# Patient Record
Sex: Male | Born: 1995 | Race: White | Hispanic: No | Marital: Single | Smoking: Never smoker
Health system: Southern US, Community
[De-identification: ages and names within clinical notes are randomized; demographics above are authoritative.]

## PROBLEM LIST (undated history)

## (undated) DIAGNOSIS — R1310 Dysphagia, unspecified: Secondary | ICD-10-CM

## (undated) DIAGNOSIS — F1990 Other psychoactive substance use, unspecified, uncomplicated: Secondary | ICD-10-CM

## (undated) DIAGNOSIS — T7840XA Allergy, unspecified, initial encounter: Secondary | ICD-10-CM

## (undated) DIAGNOSIS — F988 Other specified behavioral and emotional disorders with onset usually occurring in childhood and adolescence: Secondary | ICD-10-CM

## (undated) DIAGNOSIS — J45909 Unspecified asthma, uncomplicated: Secondary | ICD-10-CM

## (undated) HISTORY — DX: Allergy, unspecified, initial encounter: T78.40XA

## (undated) HISTORY — DX: Unspecified asthma, uncomplicated: J45.909

## (undated) HISTORY — PX: TONSILLECTOMY AND ADENOIDECTOMY: SUR1326

## (undated) HISTORY — DX: Dysphagia, unspecified: R13.10

## (undated) HISTORY — DX: Other specified behavioral and emotional disorders with onset usually occurring in childhood and adolescence: F98.8

## (undated) HISTORY — DX: Other psychoactive substance use, unspecified, uncomplicated: F19.90

---
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
## 2017-04-09 ENCOUNTER — Encounter: Payer: Self-pay | Admitting: Physician Assistant

## 2018-07-01 ENCOUNTER — Ambulatory Visit
Admission: EM | Admit: 2018-07-01 | Discharge: 2018-07-01 | Disposition: A | Discharge status: Home / Self Care | Payer: BC Managed Care – PPO

## 2018-07-01 ENCOUNTER — Other Ambulatory Visit: Payer: Self-pay

## 2018-07-01 ENCOUNTER — Encounter: Payer: Self-pay | Admitting: Family Medicine

## 2018-07-01 DIAGNOSIS — H60392 Other infective otitis externa, left ear: Secondary | ICD-10-CM | POA: Diagnosis not present

## 2018-07-01 MED ORDER — NEOMYCIN-POLYMYXIN-HC 3.5-10000-1 OT SUSP: 4.0000 [drp] | Freq: Three times a day (TID) | OTIC | 0 refills | Status: DC

## 2018-07-01 NOTE — ED Notes (Signed)
Patient able to ambulate independently  

## 2018-07-01 NOTE — ED Triage Notes (Signed)
Pt presents to [facility] for assessment of left ear pain, fullness, and dulled hearing x 3 days.  States he has used hydrogen peroxide and an Environmental education officer without relief.

## 2018-07-01 NOTE — ED Provider Notes (Signed)
Lindsay   [id number] 07/01/18 Arrival Time: 3244  ASSESSMENT & PLAN:  1. Infective otitis externa of left ear     Meds ordered this encounter  Medications   neomycin-polymyxin-hydrocortisone (CORTISPORIN) 3.5-10000-1 OTIC suspension    Sig: Place 4 drops into the left ear 3 (three) times daily.    Dispense:  10 mL    Refill:  0   Discussed typical duration of symptoms. OTC symptom care as needed. Ensure adequate fluid intake and rest. May f/u with PCP or here as needed if not improving over the next 3-5 days.  Reviewed expectations re: course of current medical issues. Questions answered. Outlined signs and symptoms indicating need for more acute intervention. Patient verbalized understanding. After Visit Summary given.   SUBJECTIVE: History from: patient.  Miking Usrey is a 23 y.o. male who presents with complaint of left otalgia; without drainage; without bleeding. Onset gradual, over the past 2-3 d. Recent cold symptoms: none. Fever: no. Overall normal PO intake without n/v. Sick contacts: none known. No significant hearing changes. No FB inserted into ears. OTC treatment: OTC H2O2 and 'ear cleaning kit' without much relief.  Social History   Tobacco Use  Smoking Status Never Smoker  Smokeless Tobacco Never Used    ROS: As per HPI.   OBJECTIVE:  Vitals:   07/01/18 1044  BP: 140/85  Pulse: 81  Resp: 18  Temp: 98.6 F (37 C)  TempSrc: Oral  SpO2: 97%     General appearance: alert; NAD HEENT: throat normal Ear Canal: edema and inflammation on the left TM: bilateral: normal and intact Neck: supple without LAD Lungs: unlabored respirations, symmetrical air entry; cough: absent; no respiratory distress Skin: warm and dry Psychological: alert and cooperative; normal mood and affect  No Known Allergies  Past Medical History:  Diagnosis Date   ADD (attention deficit disorder)    Allergy    Seasonal, Pollen   Asthma    Once or  twice a year    Family History  Problem Relation Age of Onset   Asthma Mother    Cancer Father    Hyperlipidemia Father    Asthma Brother    Cancer Maternal Grandmother    Diabetes Paternal Grandmother    Hyperlipidemia Paternal Grandmother    Hypertension Paternal Grandmother    Social History   Socioeconomic History   Marital status: Single    Spouse name: Not on file   Number of children: Not on file   Years of education: Not on file   Highest education level: Not on file  Occupational History   Occupation: Astronomer    Comment: [employer]   Occupation: Management consultant strain: Not on file   Food insecurity    Worry: Not on file    Inability: Not on Lexicographer needs    Medical: Not on file    Non-medical: Not on file  Tobacco Use   Smoking status: Never Smoker   Smokeless tobacco: Never Used  Substance and Sexual Activity   Alcohol use: No   Drug use: No    Comment: previously tried Marijuana and Acid, once each   Sexual activity: Not Currently  Lifestyle   Physical activity    Days per week: Not on file    Minutes per session: Not on file   Stress: Not on file  Relationships   Social connections    Talks on phone: Not on file  Gets together: Not on file    Attends religious service: Not on file    Active member of club or organization: Not on file    Attends meetings of clubs or organizations: Not on file    Relationship status: Not on file   Intimate partner violence    Fear of current or ex partner: Not on file    Emotionally abused: Not on file    Physically abused: Not on file    Forced sexual activity: Not on file  Other Topics Concern   Not on file  Social History Narrative   Attends [employer] intent to transfer with a degree in Math/ Business   Currently lives at home with his mother and younger brothers (twins).                Vanessa Kick, MD 07/01/18 [phone]

## 2018-08-10 ENCOUNTER — Ambulatory Visit: Payer: Self-pay | Admitting: Family Medicine

## 2018-08-10 DIAGNOSIS — Z0289 Encounter for other administrative examinations: Secondary | ICD-10-CM

## 2021-04-04 DIAGNOSIS — Z0289 Encounter for other administrative examinations: Secondary | ICD-10-CM

## 2021-04-10 ENCOUNTER — Ambulatory Visit (INDEPENDENT_AMBULATORY_CARE_PROVIDER_SITE_OTHER): Payer: 59 | Admitting: Family Medicine

## 2021-04-10 ENCOUNTER — Encounter (INDEPENDENT_AMBULATORY_CARE_PROVIDER_SITE_OTHER): Payer: Self-pay | Admitting: Family Medicine

## 2021-04-10 VITALS — BP 113/74 | HR 101 | Temp 98.5°F | Ht 71.0 in | Wt >= 6400 oz

## 2021-04-10 DIAGNOSIS — R5383 Other fatigue: Secondary | ICD-10-CM | POA: Diagnosis not present

## 2021-04-10 DIAGNOSIS — R0602 Shortness of breath: Secondary | ICD-10-CM | POA: Diagnosis not present

## 2021-04-10 DIAGNOSIS — R4589 Other symptoms and signs involving emotional state: Secondary | ICD-10-CM | POA: Diagnosis not present

## 2021-04-10 DIAGNOSIS — J45909 Unspecified asthma, uncomplicated: Secondary | ICD-10-CM | POA: Diagnosis not present

## 2021-04-10 DIAGNOSIS — Z1331 Encounter for screening for depression: Secondary | ICD-10-CM

## 2021-04-10 DIAGNOSIS — E668 Other obesity: Secondary | ICD-10-CM | POA: Diagnosis not present

## 2021-04-10 DIAGNOSIS — Z0000 Encounter for general adult medical examination without abnormal findings: Secondary | ICD-10-CM

## 2021-04-10 DIAGNOSIS — Z9189 Other specified personal risk factors, not elsewhere classified: Secondary | ICD-10-CM

## 2021-04-10 DIAGNOSIS — F988 Other specified behavioral and emotional disorders with onset usually occurring in childhood and adolescence: Secondary | ICD-10-CM

## 2021-04-10 DIAGNOSIS — Z6841 Body Mass Index (BMI) 40.0 and over, adult: Secondary | ICD-10-CM

## 2021-04-11 LAB — CBC WITH DIFFERENTIAL/PLATELET
Basophils Absolute: 0.1 10*3/uL (ref 0.0–0.2)
Basos: 1 %
EOS (ABSOLUTE): 0.4 10*3/uL (ref 0.0–0.4)
Eos: 4 %
Hematocrit: 45.6 % (ref 37.5–51.0)
Hemoglobin: 15.3 g/dL (ref 13.0–17.7)
Immature Grans (Abs): 0.1 10*3/uL (ref 0.0–0.1)
Immature Granulocytes: 1 %
Lymphocytes Absolute: 2.4 10*3/uL (ref 0.7–3.1)
Lymphs: 23 %
MCH: 29.2 pg (ref 26.6–33.0)
MCHC: 33.6 g/dL (ref 31.5–35.7)
MCV: 87 fL (ref 79–97)
Monocytes Absolute: 0.7 10*3/uL (ref 0.1–0.9)
Monocytes: 7 %
Neutrophils Absolute: 6.8 10*3/uL (ref 1.4–7.0)
Neutrophils: 64 %
Platelets: 268 10*3/uL (ref 150–450)
RBC: 5.24 x10E6/uL (ref 4.14–5.80)
RDW: 13.2 % (ref 11.6–15.4)
WBC: 10.5 10*3/uL (ref 3.4–10.8)

## 2021-04-11 LAB — HEMOGLOBIN A1C
Est. average glucose Bld gHb Est-mCnc: 123 mg/dL
Hgb A1c MFr Bld: 5.9 % — ABNORMAL HIGH (ref 4.8–5.6)

## 2021-04-11 LAB — COMPREHENSIVE METABOLIC PANEL
ALT: 49 IU/L — ABNORMAL HIGH (ref 0–44)
AST: 26 IU/L (ref 0–40)
Albumin/Globulin Ratio: 1.5 (ref 1.2–2.2)
Albumin: 4.3 g/dL (ref 4.1–5.2)
Alkaline Phosphatase: 83 IU/L (ref 44–121)
BUN/Creatinine Ratio: 14 (ref 9–20)
BUN: 13 mg/dL (ref 6–20)
Bilirubin Total: 0.4 mg/dL (ref 0.0–1.2)
CO2: 23 mmol/L (ref 20–29)
Calcium: 9.6 mg/dL (ref 8.7–10.2)
Chloride: 98 mmol/L (ref 96–106)
Creatinine, Ser: 0.94 mg/dL (ref 0.76–1.27)
Globulin, Total: 2.9 g/dL (ref 1.5–4.5)
Glucose: 89 mg/dL (ref 70–99)
Potassium: 4.4 mmol/L (ref 3.5–5.2)
Sodium: 138 mmol/L (ref 134–144)
Total Protein: 7.2 g/dL (ref 6.0–8.5)
eGFR: 116 mL/min/{1.73_m2} (ref >59)

## 2021-04-11 LAB — LIPID PANEL
Chol/HDL Ratio: 2.9 ratio (ref 0.0–5.0)
Cholesterol, Total: 155 mg/dL (ref 100–199)
HDL: 53 mg/dL (ref >39)
LDL Chol Calc (NIH): 91 mg/dL (ref 0–99)
Triglycerides: 50 mg/dL (ref 0–149)
VLDL Cholesterol Cal: 11 mg/dL (ref 5–40)

## 2021-04-11 LAB — TSH: TSH: 3.13 u[IU]/mL (ref 0.450–4.500)

## 2021-04-11 LAB — VITAMIN B12: Vitamin B-12: 591 pg/mL (ref 232–1245)

## 2021-04-11 LAB — INSULIN, RANDOM: INSULIN: 20.5 u[IU]/mL (ref 2.6–24.9)

## 2021-04-11 LAB — T4, FREE: Free T4: 1.22 ng/dL (ref 0.82–1.77)

## 2021-04-11 LAB — FOLATE: Folate: 5.6 ng/mL (ref >3.0)

## 2021-04-11 LAB — VITAMIN D 25 HYDROXY (VIT D DEFICIENCY, FRACTURES): Vit D, 25-Hydroxy: 13.7 ng/mL — ABNORMAL LOW (ref 30.0–100.0)

## 2021-04-13 DIAGNOSIS — R4589 Other symptoms and signs involving emotional state: Secondary | ICD-10-CM | POA: Insufficient documentation

## 2021-04-18 NOTE — Progress Notes (Signed)
? ? ? ? ?Chief Complaint:  ? ?OBESITY ?Brian Calderon (MR# [id number]) is a 26 y.o. male who presents for evaluation and treatment of obesity and related comorbidities. Current Body mass index is 68.34 kg/m?Brian Calderon has been struggling with his weight for many years and has been unsuccessful in either losing weight, maintaining weight loss, or reaching his healthy weight goal. ? 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 He has a [place] sheppard dog name Kendal Hymen. He has tried Paleo, Keto, and [employer] Watcher's in the past. He desires almost a 250 lbs weight loss in 2 years. Craves steak and chicken. Snacks on candy and fruit. Drinks a lot of calorie beverages and eats out 7 days per week. Lowest weight was 315-330 lbs as a senior in high school that he can recall.  ? ?[name] is currently in the action stage of change and ready to dedicate time achieving and maintaining a healthier weight. [name] is interested in becoming our patient and working on intensive lifestyle modifications including (but not limited to) diet and exercise for weight loss. ? ?[name]'s habits were reviewed today and are as follows: His family eats meals together, his desired weight loss is 215 lbs, he has been heavy most of his life, he started gaining weight at 26 years old, his heaviest weight ever was 450 pounds, he has significant food cravings issues, he snacks frequently in the evenings, he wakes up frequently in the middle of the night to eat, he skips meals frequently, he is frequently drinking liquids with calories, he frequently makes poor food choices, he has problems with excessive hunger, he frequently eats larger portions than normal, he has binge eating behaviors, and he struggles with emotional eating. ? ?Depression Screen ?Darrow's Food and Mood (modified PHQ-9) score was 17. ? ? ?  04/10/2021  ?  9:16 AM  ?Depression screen PHQ 2/9  ?Decreased Interest 2  ?Down, Depressed, Hopeless 2  ?PHQ - 2 Score 4  ?Altered  sleeping 3  ?Tired, decreased energy 3  ?Change in appetite 2  ?Feeling bad or failure about yourself  2  ?Trouble concentrating 2  ?Moving slowly or fidgety/restless 1  ?PHQ-9 Score 17  ?Difficult doing work/chores Somewhat difficult  ? ?Subjective:  ? ?1. Other fatigue ?Brian Calderon admits to daytime somnolence and admits to waking up still tired. Patient has a history of symptoms of daytime fatigue and morning fatigue. Brian Calderon generally gets 6 or 7 hours of sleep per night, and states that he has nightime awakenings. Snoring is present. Apneic episodes are present. Epworth Sleepiness Score is 8.  ? ?2. SOB (shortness of breath) on exertion ?Brian Calderon notes increasing shortness of breath with exercising and seems to be worsening over time with weight gain. He notes getting out of breath sooner with activity than he used to. This has not gotten worse recently. Brian Calderon denies shortness of breath at rest or orthopnea. ? ?3. Reactive airway disease without complication, unspecified asthma severity, unspecified whether persistent ?Brian Calderon is off his medications now. He used to use albuterol as needed.  ? ?4. Attention deficit disorder, w/o hyperactivity presence ?Brian Calderon is off his medications and has been for several months. He was taking Vyvanse in the past.  ? ?5. Health care maintenance ? ?6. Depressed mood- emotional eating ?Brian Calderon's PHQ-9 score is 17. He denies depression or use of medications in the past. He feels his weight can get him down but he is not depressed. He denies suicidal ideations.  ? ?7.  At risk for impaired metabolic function ?Brian Calderon is at increased risk for impaired metabolic function due to obesity.  ? ?Assessment/Plan:  ? ?Orders Placed This Encounter  ?Procedures  ? CBC with Differential/Platelet  ? Comprehensive metabolic panel  ? Hemoglobin A1c  ? Insulin, random  ? Lipid panel  ? T4, free  ? TSH  ? VITAMIN D 25 Hydroxy (Vit-D Deficiency, Fractures)  ? Vitamin B12  ? Folate  ? EKG 12-Lead   ? ? ?Medications Discontinued During This Encounter  ?Medication Reason  ? neomycin-polymyxin-hydrocortisone (CORTISPORIN) 3.5-10000-1 OTIC suspension Completed Course  ?  ? ?No orders of the defined types were placed in this encounter. ?  ? ?1. Other fatigue ?Brian Calderon does feel that his weight is causing his energy to be lower than it should be. Fatigue may be related to obesity, depression or many other causes. Labs will be ordered, and in the meanwhile, Brian Calderon will focus on self care including making healthy food choices, increasing physical activity and focusing on stress reduction. ? ?- EKG 12-Lead ?- CBC with Differential/Platelet ?- T4, free ?- TSH ?- VITAMIN D 25 Hydroxy (Vit-D Deficiency, Fractures) ?- Vitamin B12 ?- Folate ? ?2. SOB (shortness of breath) on exertion ?Brian Calderon does feel that he gets out of breath more easily that he used to when he exercises. Brian Calderon's shortness of breath appears to be obesity related and exercise induced. He has agreed to work on weight loss and gradually increase exercise to treat his exercise induced shortness of breath. Will continue to monitor closely. ? ?3. Reactive airway disease without complication, unspecified asthma severity, unspecified whether persistent ?[name]'s symptoms are stable currently. He is asymptomatic. He will continue his current treatment plan and regular follow up with his PCP.  ? ?- Comprehensive metabolic panel ? ?4. Attention deficit disorder, w/o hyperactivity presence ?Brian Calderon will follow up with Theora Gianotti, PCP in the future as needed for management of ADD. He will follow his prudent nutritional plan and I discussed with the patient how decreasing simple carbs can improve focus  with healthy eating.  ? ?5. Health care maintenance ?We will check labs today.  ? ?- Comprehensive metabolic panel ?- Hemoglobin A1c ?- Insulin, random ?- Lipid panel ?- TSH ? ?6. Depressed mood- emotional eating ?Brian Calderon declines Dr. Dewaine Conger referral currently.  We will monitor closely. He denies emotional eating really at all despite his answers on the intake form. ? ?7. At risk for impaired metabolic function ?Due to Manual's current state of health and medical condition(s), he is at a significantly higher risk for impaired metabolic function.   At least 30 minutes was spent on counseling Fumio about these concerns today.  This places the patient at a much greater risk to subsequently develop cardio-pulmonary conditions that can negatively affect the patient's quality of life.  I stressed the importance of reversing these risks factors.  The initial goal is to lose at least 5-10% of starting weight to help reduce risk factors.  Counseling:  Intensive lifestyle modifications discussed with Moiz as the most appropriate first line treatment.  he will continue to work on diet, exercise, and weight loss efforts.  We will continue to reassess these conditions on a fairly regular basis in an attempt to decrease the patient's overall morbidity and mortality. ? ?8. Class 3 severe obesity with serious comorbidity and body mass index (BMI) of 60.0 to 69.9 in adult, unspecified obesity type (HCC) ?Orley is currently in the action stage of change and his goal is to  continue with weight loss efforts. I recommend Brian CoderZachary begin the structured treatment plan as follows: ? ?He has agreed to keeping a food journal and adhering to recommended goals of 2800-3000 calories and 180+ grams of protein daily. ? ?Brian CoderZachary was given a Category 4 meal plan so he can get an ideas of what food to eat, but he understands that he needs much larger amounts.  ? ?Exercise goals: As is.  ? ?Behavioral modification strategies: increasing lean protein intake, decreasing simple carbohydrates, decreasing eating out, and keeping a strict food journal. ? ?He was informed of the importance of frequent follow-up visits to maximize his success with intensive lifestyle modifications for his multiple health  conditions. He was informed we would discuss his lab results at his next visit unless there is a critical issue that needs to be addressed sooner. Brian CoderZachary agreed to keep his next visit at the agreed upon time to disc

## 2021-04-24 ENCOUNTER — Ambulatory Visit (INDEPENDENT_AMBULATORY_CARE_PROVIDER_SITE_OTHER): Payer: 59 | Admitting: Family Medicine

## 2021-04-24 ENCOUNTER — Encounter (INDEPENDENT_AMBULATORY_CARE_PROVIDER_SITE_OTHER): Payer: Self-pay | Admitting: Family Medicine

## 2021-04-24 VITALS — BP 132/80 | HR 105 | Temp 98.3°F | Ht 71.0 in | Wt >= 6400 oz

## 2021-04-24 DIAGNOSIS — R7303 Prediabetes: Secondary | ICD-10-CM

## 2021-04-24 DIAGNOSIS — Z6841 Body Mass Index (BMI) 40.0 and over, adult: Secondary | ICD-10-CM

## 2021-04-24 DIAGNOSIS — E669 Obesity, unspecified: Secondary | ICD-10-CM | POA: Diagnosis not present

## 2021-04-24 DIAGNOSIS — E559 Vitamin D deficiency, unspecified: Secondary | ICD-10-CM | POA: Diagnosis not present

## 2021-04-24 DIAGNOSIS — K760 Fatty (change of) liver, not elsewhere classified: Secondary | ICD-10-CM | POA: Diagnosis not present

## 2021-04-24 DIAGNOSIS — Z9189 Other specified personal risk factors, not elsewhere classified: Secondary | ICD-10-CM

## 2021-04-24 MED ORDER — VITAMIN D (ERGOCALCIFEROL) 1.25 MG (50000 UNIT) PO CAPS: 50000.0000 [IU] | ORAL_CAPSULE | ORAL | 0 refills | Status: AC

## 2021-05-07 NOTE — Progress Notes (Signed)
?TeleHealth Visit:  ?This visit was completed with telemedicine (audio/video) technology. ?Brian Calderon has verbally consented to this TeleHealth visit. The patient is located at home, the provider is located at home. The participants in this visit include the listed provider and patient. The visit was conducted today via MyChart video. ? ?OBESITY ?Brian Calderon is here to discuss his progress with his obesity treatment plan along with follow-up of his obesity related diagnoses.  ? ?Today's visit was # 3 ?Starting weight: 490 lbs ?Starting date: 04/10/21 ?Weight at last in office visit: 487 lbs on 04/24/21 ?Total weight loss: 3 lbs at last in office visit on 04/24/21. ?Today's reported weight:  No weight reported. ? ?Nutrition Plan: keeping a food journal and adhering to recommended goals of 2800-3000 calories and 180 gms protein.  ?Hunger is well controlled. Cravings are moderately controlled.  ?Current exercise: none ? ?Interim History: Brian Calderon feels he is doing very well with the meal plan.  He saw his PCP 2 days ago and his weight was down to 483, a total of 7 pounds lost.  He journals consistently, about 6 days/week and mostly meets his calorie and protein goals.  He denies intake of sugar sweetened beverages other than fair life chocolate and regular milk.  However he is journaling all of his liquid calories also. ?He says that he does cook and is doing some meal prep at his grandma's house because she has a better kitchen. ?He was changed from Vyvanse to Ritalin by his PCP for ADD.  He reports when he takes Vyvanse his appetite is suppressed for several hours and he is unable to get in all of the protein he is supposed to eat. ? ?He prefers in office visits. ? ?Assessment/Plan:  ?1. Vitamin D Deficiency ?Vitamin D is not at goal of 50. He is on weekly prescription Vitamin D 50,000 IU.  He notes fatigue.  He was wondering how long he will need to take vitamin D. ?Lab Results  ?Component Value Date  ? VD25OH 13.7 (L)  04/10/2021  ? ? ?Plan: ?Continue prescription vitamin D 50,000 IU weekly. ?We discussed that he will likely have to be on vitamin D for several months to get his vitamin D up to goal. ? ?2. Snoring ?Brian Calderon reports that his sleep quality is very poor.  He reports fatigue.  He has been told that he snores very badly.  He was referred to sleep medicine at his PCP visit 2 days ago. ? ?Plan: ?We discussed the importance of treating sleep apnea if he indeed does have this.  He will follow-up with sleep medicine for sleep study. ? ?3. Obesity: Current BMI 67.95 ?Brian Calderon is currently in the action stage of change. As such, his goal is to continue with weight loss efforts.  ?He has agreed to keeping a food journal and adhering to recommended goals of 2800-3000 calories and 180+ gms protein.  ? ?Exercise goals: No exercise has been prescribed at this time. ? ?Behavioral modification strategies: decreasing simple carbohydrates, better snacking choices, and keeping a strict food journal. ? ?Brian Calderon has agreed to follow-up with our clinic in 2 weeks.  ? ?No orders of the defined types were placed in this encounter. ? ? ?Medications Discontinued During This Encounter  ?Medication Reason  ? lisdexamfetamine (VYVANSE) 70 MG capsule Change in therapy  ? lisdexamfetamine (VYVANSE) 70 MG capsule Change in therapy  ? lisdexamfetamine (VYVANSE) 70 MG capsule Change in therapy  ?  ? ?No orders of the defined types were placed  in this encounter. ?   ? ?Objective:  ? ?VITALS: Per patient if applicable, see vitals. ?GENERAL: Alert and in no acute distress. ?CARDIOPULMONARY: No increased WOB. Speaking in clear sentences.  ?PSYCH: Pleasant and cooperative. Speech normal rate and rhythm. Affect is appropriate. Insight and judgement are appropriate. Attention is focused, linear, and appropriate.  ?NEURO: Oriented as arrived to appointment on time with no prompting.  ? ?Lab Results  ?Component Value Date  ? CREATININE 0.94 04/10/2021  ? BUN 13  04/10/2021  ? NA 138 04/10/2021  ? K 4.4 04/10/2021  ? CL 98 04/10/2021  ? CO2 23 04/10/2021  ? ?Lab Results  ?Component Value Date  ? ALT 49 (H) 04/10/2021  ? AST 26 04/10/2021  ? ALKPHOS 83 04/10/2021  ? BILITOT 0.4 04/10/2021  ? ?Lab Results  ?Component Value Date  ? HGBA1C 5.9 (H) 04/10/2021  ? ?Lab Results  ?Component Value Date  ? INSULIN 20.5 04/10/2021  ? ?Lab Results  ?Component Value Date  ? TSH 3.130 04/10/2021  ? ?Lab Results  ?Component Value Date  ? CHOL 155 04/10/2021  ? HDL 53 04/10/2021  ? LDLCALC 91 04/10/2021  ? TRIG 50 04/10/2021  ? CHOLHDL 2.9 04/10/2021  ? ?Lab Results  ?Component Value Date  ? WBC 10.5 04/10/2021  ? HGB 15.3 04/10/2021  ? HCT 45.6 04/10/2021  ? MCV 87 04/10/2021  ? PLT 268 04/10/2021  ? ?No results found for: IRON, TIBC, FERRITIN ?Lab Results  ?Component Value Date  ? VD25OH 13.7 (L) 04/10/2021  ? ? ?Attestation Statements:  ? ?Reviewed by clinician on day of visit: allergies, medications, problem list, medical history, surgical history, family history, social history, and previous encounter notes. ? ?Time spent on visit including pre-visit chart review and post-visit charting and care was 35 minutes.  ? ? ?

## 2021-05-08 ENCOUNTER — Telehealth (INDEPENDENT_AMBULATORY_CARE_PROVIDER_SITE_OTHER): Payer: 59 | Admitting: Family Medicine

## 2021-05-08 ENCOUNTER — Encounter (INDEPENDENT_AMBULATORY_CARE_PROVIDER_SITE_OTHER): Payer: Self-pay | Admitting: Family Medicine

## 2021-05-08 DIAGNOSIS — E559 Vitamin D deficiency, unspecified: Secondary | ICD-10-CM | POA: Diagnosis not present

## 2021-05-08 DIAGNOSIS — R0683 Snoring: Secondary | ICD-10-CM | POA: Diagnosis not present

## 2021-05-08 DIAGNOSIS — Z6841 Body Mass Index (BMI) 40.0 and over, adult: Secondary | ICD-10-CM | POA: Diagnosis not present

## 2021-05-08 DIAGNOSIS — E669 Obesity, unspecified: Secondary | ICD-10-CM | POA: Diagnosis not present

## 2021-05-13 NOTE — Progress Notes (Signed)
? ? ? ?Chief Complaint:  ? ?OBESITY ?Brian Calderon is here to discuss his progress with his obesity treatment plan along with follow-up of his obesity related diagnoses. Brian Calderon is on the Category 4 Plan and states he is following his eating plan approximately 60% of the time. Brian Calderon states he is not currently exercising. ? ?Today's visit was #: 2 ?Starting weight: 490 lbs ?Starting date: 04/10/2021 ?Today's weight: 487 lbs ?Today's date: 04/24/2021 ?Total lbs lost to date: 3 ?Total lbs lost since last in-office visit: 3 ? ?Interim History: Brian Calderon is here today for his first follow-up office visit since starting the program with Brian Calderon.  All blood work/ lab tests that were recently ordered by myself or an outside provider were reviewed with patient today per their request.   Extended time was spent counseling him on all new disease processes that were discovered or preexisting ones that are affected by BMI.  he understands that many of these abnormalities will need to monitored regularly along with the current treatment plan of prudent dietary changes, in which we are making each and every office visit, to improve these health parameters. Brian Calderon did great for 5 days, but after her didn't follow it as well. He  did not journal or track the entire time.  ? ?Subjective:  ? ?1. NAFLD (nonalcoholic fatty liver disease) ?New diagnosis. Elevated ALT and no history of this. No ETOH in excessive. I discussed labs with the patient today. ? ?2. Pre-diabetes ?New diagnosis. A1c 5.9 and fasting insulin 20.5. I discussed labs with the patient today.  ? ?3. Vitamin D deficiency ?New diagnosis. Notes fatigue. I discussed labs with the patient today. ? ?4. At risk for diabetes mellitus ?Brian Calderon is at higher than average risk for developing diabetes due to his obesity. ? ?Assessment/Plan:  ?No orders of the defined types were placed in this encounter. ? ? ?There are no discontinued medications.  ? ?Meds ordered this encounter   ?Medications  ? Vitamin D, Ergocalciferol, (DRISDOL) 1.25 MG (50000 UNIT) CAPS capsule  ?  Sig: Take 1 capsule (50,000 Units total) by mouth every 7 (seven) days.  ?  Dispense:  4 capsule  ?  Refill:  0  ?  ? ?1. NAFLD (nonalcoholic fatty liver disease) ?We discussed the likely diagnosis of non-alcoholic fatty liver disease today and how this condition is obesity related. Brian Calderon was educated the importance of weight loss. Brian Calderon agreed to continue with his weight loss efforts with healthier diet and exercise as an essential part of his treatment plan. ? ?2. Pre-diabetes ?Extensive counseling done on this condition and causes for it and treatments. Handouts given on pre-diabetes and insulin resistance. Brian Calderon will continue to work on weight loss, exercise, and decreasing simple carbohydrates to help decrease the risk of diabetes.  ? ?3. Vitamin D deficiency ?Start prescription Vitamin D once weekly with no refills. ? ?- I discussed the importance of vitamin D to the patient's health and well-being.  ?- I reviewed possible symptoms of low Vitamin D:  low energy, depressed mood, muscle aches, joint aches, osteoporosis etc. was reviewed with patient ?- low Vitamin D levels may be linked to an increased risk of cardiovascular events and even increased risk of cancers- such as colon and breast.  ?- ideal vitamin D levels reviewed with patient  ?- I recommend pt take a weekly prescription vit D - see script below   ?- Informed patient this may be a lifelong thing, and he was encouraged to continue to  take the medicine until told otherwise.    ?- weight loss will likely improve availability of vitamin D, thus encouraged Brian Calderon to continue with meal plan and their weight loss efforts to further improve this condition.  Thus, we will need to monitor levels regularly (every 3-4 mo on average) to keep levels within normal limits and prevent over supplementation. ?- pt's questions and concerns regarding this condition  addressed. ? ?- Vitamin D, Ergocalciferol, (DRISDOL) 1.25 MG (50000 UNIT) CAPS capsule; Take 1 capsule (50,000 Units total) by mouth every 7 (seven) days.  Dispense: 4 capsule; Refill: 0 ? ?4. At risk for diabetes mellitus ?Brian Calderon was given diabetes prevention education and counseling today of more than 24 minutes.  ?- Counseled patient on pathophysiology of disease and meaning/ implication of lab results.  ?- Reviewed how certain foods can either stimulate or inhibit insulin release, and subsequently affect hunger pathways  ?- Importance of following a healthy meal plan with limiting amounts of simple carbohydrates discussed with patient ?- Effects of regular aerobic exercise on blood sugar regulation reviewed and encouraged an eventual goal of 30 min 5d/week or more as a minimum.  ?- Briefly discussed treatment options, which always include dietary and lifestyle modification as first line.   ?- Handouts provided at patient's desire and/or told to go online to the American Diabetes Association website for further information. ? ?5. Obesity with current BMI of 67.95 ?Brian Calderon is currently in the action stage of change. As such, his goal is to continue with weight loss efforts. He has agreed to the Category 4 Plan.  ? ?Extensive counseling done on his several new diagnosis, which are all directly related to elevated BMI, education done on various food and how those food affect his medical conditions.  ? ?Exercise goals: As is. ? ?Behavioral modification strategies: decreasing eating out and avoiding temptations. ? ?Brian Calderon has agreed to follow-up with our clinic in 2 to 4 weeks. He was informed of the importance of frequent follow-up visits to maximize his success with intensive lifestyle modifications for his multiple health conditions.  ? ?Objective:  ? ?Blood pressure 132/80, pulse (!) 105, temperature 98.3 ?F (36.8 ?C), height 5\' 11"  (1.803 m), weight (!) 487 lb (220.9 kg), SpO2 97 %. ?Body mass index is 67.92  kg/m?. ? ?General: Cooperative, alert, well developed, in no acute distress. ?HEENT: Conjunctivae and lids unremarkable. ?Cardiovascular: Regular rhythm.  ?Lungs: Normal work of breathing. ?Neurologic: No focal deficits.  ? ?Lab Results  ?Component Value Date  ? CREATININE 0.94 04/10/2021  ? BUN 13 04/10/2021  ? NA 138 04/10/2021  ? K 4.4 04/10/2021  ? CL 98 04/10/2021  ? CO2 23 04/10/2021  ? ?Lab Results  ?Component Value Date  ? ALT 49 (H) 04/10/2021  ? AST 26 04/10/2021  ? ALKPHOS 83 04/10/2021  ? BILITOT 0.4 04/10/2021  ? ?Lab Results  ?Component Value Date  ? HGBA1C 5.9 (H) 04/10/2021  ? ?Lab Results  ?Component Value Date  ? INSULIN 20.5 04/10/2021  ? ?Lab Results  ?Component Value Date  ? TSH 3.130 04/10/2021  ? ?Lab Results  ?Component Value Date  ? CHOL 155 04/10/2021  ? HDL 53 04/10/2021  ? LDLCALC 91 04/10/2021  ? TRIG 50 04/10/2021  ? CHOLHDL 2.9 04/10/2021  ? ?Lab Results  ?Component Value Date  ? VD25OH 13.7 (L) 04/10/2021  ? ?Lab Results  ?Component Value Date  ? WBC 10.5 04/10/2021  ? HGB 15.3 04/10/2021  ? HCT 45.6 04/10/2021  ?  MCV 87 04/10/2021  ? PLT 268 04/10/2021  ? ?No results found for: IRON, TIBC, FERRITIN ? ?Attestation Statements:  ? ?Reviewed by clinician on day of visit: allergies, medications, problem list, medical history, surgical history, family history, social history, and previous encounter notes. ? ? ?I, Burt Knack, am acting as transcriptionist for [employer], DO. ? ?I have reviewed the above documentation for accuracy and completeness, and I agree with the above. Carlye Grippe, D.O. ? ?The 21st Century Cures Act was signed into law in 2016 which includes the topic of electronic health records.  This provides immediate access to information in MyChart.  This includes consultation notes, operative notes, office notes, lab results and pathology reports.  If you have any questions about what you read please let us know at your next visit so we can discuss your  concerns and take corrective action if need be.  We are right here with you. ? ? ?

## 2021-05-22 ENCOUNTER — Ambulatory Visit (INDEPENDENT_AMBULATORY_CARE_PROVIDER_SITE_OTHER): Payer: 59 | Admitting: Family Medicine

## 2021-07-15 ENCOUNTER — Other Ambulatory Visit (INDEPENDENT_AMBULATORY_CARE_PROVIDER_SITE_OTHER): Payer: Self-pay | Admitting: Family Medicine

## 2021-07-15 DIAGNOSIS — E559 Vitamin D deficiency, unspecified: Secondary | ICD-10-CM

## 2021-08-14 ENCOUNTER — Encounter (INDEPENDENT_AMBULATORY_CARE_PROVIDER_SITE_OTHER): Payer: Self-pay
